# Patient Record
Sex: Male | Born: 1990 | Race: White | Hispanic: No | Marital: Single | Smoking: Never smoker
Health system: Southern US, Community
[De-identification: ages and names within clinical notes are randomized; demographics above are authoritative.]

## PROBLEM LIST (undated history)

## (undated) DIAGNOSIS — G4730 Sleep apnea, unspecified: Secondary | ICD-10-CM

## (undated) DIAGNOSIS — Z8711 Personal history of peptic ulcer disease: Secondary | ICD-10-CM

---

## 2011-11-09 ENCOUNTER — Emergency Department (HOSPITAL_COMMUNITY)
Admission: EM | Admit: 2011-11-09 | Discharge: 2011-11-09 | Disposition: A | Discharge status: Home / Self Care | Payer: Self-pay | Attending: Emergency Medicine | Admitting: Emergency Medicine

## 2011-11-09 ENCOUNTER — Encounter (HOSPITAL_COMMUNITY): Payer: Self-pay

## 2011-11-09 DIAGNOSIS — R070 Pain in throat: Secondary | ICD-10-CM | POA: Insufficient documentation

## 2011-11-09 DIAGNOSIS — B079 Viral wart, unspecified: Secondary | ICD-10-CM | POA: Insufficient documentation

## 2011-11-09 NOTE — ED Notes (Signed)
Pt d/c home in NAD.Pt voiced understanding of follow up care. Pt denies any pain.

## 2011-11-09 NOTE — ED Notes (Signed)
Cold symptoms and sore throat for approximately 5 days   Also has had warts on both hands for 2 years.

## 2011-11-09 NOTE — ED Provider Notes (Signed)
History     CSN: [id number]  Arrival date & time 11/09/11  4010   First MD Initiated Contact with Patient 11/09/11 1042      Chief Complaint  Patient presents with  . Sore Throat    (Consider location/radiation/quality/duration/timing/severity/associated sxs/prior treatment) HPI  21 year old male presents to the ED complaining of warts on hands. Patient states for the past 2 years he has been noticing warts on both of his hands. Warts are nontender and non itchy.  He has tried over-the-counter Dr. Margart Sickles cream, and has tried to use Band-Aid with minimal improvement. He is requesting for alternative treatment. He denies seeing warts anywhere else in his body. Patient state he left his hand on a regular basis but still notice new warts forming. He also states has a mild sore throat but denies fever, sneezing, running nose, productive cough, and voice changes, or neck pain. his primary concern today is treatment for warts  History reviewed. No pertinent past medical history.  History reviewed. No pertinent past surgical history.  No family history on file.  History  Substance Use Topics  . Smoking status: Never Smoker   . Smokeless tobacco: Not on file  . Alcohol Use: No      Review of Systems  All other systems reviewed and are negative.    Allergies  Review of patient's allergies indicates no known allergies.  Home Medications   Current Outpatient Rx  Name Route Sig Dispense Refill  . CALCIUM CARBONATE 1250 MG PO CHEW Oral Chew 1 tablet by mouth daily as needed. Heartburn      BP 128/74  Pulse 78  Temp(Src) 98.7 F (37.1 C) (Oral)  Resp 18  Ht 5\' 9"  (1.753 m)  Wt 230 lb (104.327 kg)  BMI 33.96 kg/m2  SpO2 97%  Physical Exam  Nursing note and vitals reviewed. Constitutional:       Awake, alert, nontoxic appearance  HENT:  Head: Atraumatic.  Mouth/Throat: Oropharynx is clear and moist.  Eyes: Right eye exhibits no discharge. Left eye exhibits no  discharge.  Neck: Neck supple.  Pulmonary/Chest: Effort normal. He exhibits no tenderness.  Abdominal: There is no tenderness. There is no rebound.  Musculoskeletal: He exhibits no tenderness.       Baseline ROM, no obvious new focal weakness  Lymphadenopathy:    He has no cervical adenopathy.  Neurological:       Mental status and motor strength appears baseline for patient and situation  Skin: No rash noted.       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  Non blanchable.  Vericullar appearance.  Non pustular, non petechiae  Psychiatric: He has a normal mood and affect.    ED Course  Procedures (including critical care time)  Labs Reviewed - No data to display No results found.   No diagnosis found.    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 Patient will likely benefit from followup with the dermatology department. He may benefit from cryotherapy procedure, not commonly performed in the ED. Patient throat evaluation was unremarkable, likely viral infection. I will give him a followup referral with dermatology. Patient voiced understanding and agree with plan.        Fayrene Helper, PA-C 11/09/11 1120

## 2011-11-10 NOTE — ED Provider Notes (Signed)
Medical screening examination/treatment/procedure(s) were performed by non-physician practitioner and as supervising physician I was immediately available for consultation/collaboration.  Flint Melter, MD 11/10/11 [phone]

## 2020-05-11 ENCOUNTER — Telehealth: Payer: Self-pay | Admitting: Internal Medicine

## 2020-05-11 ENCOUNTER — Other Ambulatory Visit: Payer: Self-pay

## 2020-05-11 ENCOUNTER — Ambulatory Visit (INDEPENDENT_AMBULATORY_CARE_PROVIDER_SITE_OTHER): Payer: Self-pay | Admitting: Internal Medicine

## 2020-05-11 ENCOUNTER — Encounter: Payer: Self-pay | Admitting: Internal Medicine

## 2020-05-11 VITALS — BP 118/76 | Temp 97.0°F | Ht 68.0 in | Wt 285.0 lb

## 2020-05-11 DIAGNOSIS — G4733 Obstructive sleep apnea (adult) (pediatric): Secondary | ICD-10-CM | POA: Insufficient documentation

## 2020-05-11 DIAGNOSIS — K279 Peptic ulcer, site unspecified, unspecified as acute or chronic, without hemorrhage or perforation: Secondary | ICD-10-CM

## 2020-05-11 MED ORDER — AMPHETAMINE-DEXTROAMPHET ER 20 MG PO CP24: 20.0000 mg | ORAL_CAPSULE | Freq: Every day | ORAL | 0 refills | Status: DC

## 2020-05-11 NOTE — Telephone Encounter (Signed)
First he needs to try Adderall. He remains responsible for safe, alert driving- no letter from me would change that. He can contact us Friday  morning to let us know how he is doing. I will be out of the office in the afternoon.

## 2020-05-11 NOTE — Assessment & Plan Note (Signed)
Patient reported history

## 2020-05-11 NOTE — Patient Instructions (Addendum)
Order- schedule CPAP to BIPAP titration sleep study at sleep center   Dx OSA  We will contact [facility] for a copy of the report of your original diagnostic sleep study done around Feb, 2020, and also name of DME company to which he was referred for CPAP.  Script for Adderall to try if needed for excessive sleepiness.  Be very careful driving- do not drive if sleepy

## 2020-05-11 NOTE — Telephone Encounter (Signed)
Called and spoke with pt. 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 Pt wanted to know if the adderall is working well for him if he could be allowed to return back to work prior to the titration study being preferred or if he would need to be out until he has the study.  Pt wanted to do a trial on the adderall and if he felt like it was working well for him, he wanted to return back to work Saturday, 8/14 if okayed by Dr. Maple Hudson. Pt stated he would call office Friday, 8/13 if he felt like the adderall was working well for him so he could get a note written from Dr. Maple Hudson for him to go back to work Saturday 8/14 if allowed. Pt would need to state in the note that he would be okay to return to work and able to drive a Engineer, production.  Dr. Maple Hudson, please advise on this.

## 2020-05-11 NOTE — Assessment & Plan Note (Signed)
Significant weight gain in recent years. Discussed potential impact on sleep- breathing Plan- exercise and diet

## 2020-05-11 NOTE — Progress Notes (Addendum)
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. Told he had severe apnea and got CPAP Resmed auto 5-20 then, [facility]. Has been getting supplies on-line. Over last 2 years has gained 50-75 lbs. In past 6 months especially more aware of significant daytime sleepiness. Brother who shares truck with him tells him he is not snoring through CPAP, and does not note parasomnias. Patient denies cataplexy and Sleep paralysis. 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 Has been using lots of caffeine. No sleep meds. Denies ENT surgery, heart or lung disease  Prior to Admission medications   Medication Sig Start Date End Date Taking? Authorizing Provider  acetaminophen (TYLENOL) 325 MG tablet Take by mouth. 04/18/20  Yes [provider]  amphetamine-dextroamphetamine (ADDERALL XR) 20 MG 24 hr capsule Take 1 capsule (20 mg total) by mouth daily. 05/11/20   Waymon Budge, MD   No past medical history on file. No past surgical history on file. No family history on file. Social History   Socioeconomic History  . Marital status: Single    Spouse name: Not on file  . Number of children: Not on file  . Years of education: Not on file  . Highest education level: Not on file  Occupational History  . Not on file  Tobacco Use  . Smoking status: Never Smoker  . Smokeless tobacco: Current User    Types: Snuff  Substance and Sexual Activity  . Alcohol use: No  . Drug use: No  . Sexual activity: Not on file  Other Topics Concern  . Not on file  Social History Narrative  . Not on file   Social Determinants of Health   Financial Resource Strain:   .  Difficulty of Paying Living Expenses:   Food Insecurity:   . Worried About Programme researcher, broadcasting/film/video in the Last Year:   . Barista in the Last Year:   Transportation Needs:   . Freight forwarder (Medical):   Marland Kitchen Lack of Transportation (Non-Medical):   Physical Activity:   . Days of Exercise per Week:   . Minutes of Exercise per Session:   Stress:   . Feeling of Stress :   Social Connections:   . Frequency of Communication with Friends and Family:   . Frequency of Social Gatherings with Friends and Family:   . Attends Religious Services:   . Active Member of Clubs or Organizations:   . Attends Banker Meetings:   Marland Kitchen Marital Status:   Intimate Partner Violence:   . Fear of Current or Ex-Partner:   . Emotionally Abused:   Marland Kitchen Physically Abused:   . Sexually Abused:    ROS-see HPI   + = positive Constitutional:    weight loss, night sweats, fevers, chills, fatigue, lassitude. HEENT:    headaches, difficulty swallowing, tooth/dental problems, sore throat,       sneezing, itching, ear ache, nasal congestion, post nasal drip, snoring CV:    chest pain, orthopnea, PND, swelling in lower extremities, anasarca,  dizziness, palpitations Resp:   shortness of breath with exertion or at rest.                productive cough,   non-productive cough, coughing up of blood.              change in color of mucus.  wheezing.   Skin:    rash or lesions. GI:  No-   heartburn, indigestion, abdominal pain, nausea, vomiting, diarrhea,                 change in bowel habits, loss of appetite GU: dysuria, change in color of urine, no urgency or frequency.   flank pain. MS:   joint pain, stiffness, decreased range of motion, back pain. Neuro-     nothing unusual Psych:  change in mood or affect.  depression or anxiety.   memory loss.  OBJ- Physical Exam General- Alert, Oriented, Affect-appropriate, Distress- none acute, +Obese Skin- rash-none, lesions-  none, excoriation- none Lymphadenopathy- none Head- atraumatic            Eyes- Gross vision intact, PERRLA, conjunctivae and secretions clear            Ears- Hearing, canals-normal            Nose- Clear, no-Septal dev, mucus, polyps, erosion, perforation             Throat- Mallampati III , mucosa clear , drainage- none, tonsils+ large, + teeth Neck- flexible , trachea midline, no stridor , thyroid nl, carotid no bruit Chest - symmetrical excursion , unlabored           Heart/CV- RRR , no murmur , no gallop  , no rub, nl s1 s2                           - JVD- none , edema- none, stasis changes- none, varices- none           Lung- clear to P&A, wheeze- none, cough- none , dullness-none, rub- none           Chest wall-  Abd-  Br/ Gen/ Rectal- Not done, not indicated Extrem- cyanosis- none, clubbing, none, atrophy- none, strength- nl Neuro- grossly intact to observation

## 2020-05-11 NOTE — Assessment & Plan Note (Signed)
I suspect with weight gain and large tonsils, he has out-run his CPAP machine and may need BIPAP. Narcolepsy is less likely. 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 We discussed short-term trial of Adderall while we get his original diagnostic sleep report from [place], and do a CPAP/BIPAP titration sleep study.

## 2020-05-12 NOTE — Telephone Encounter (Signed)
Patient is returning phone call. Patient phone number is [phone].

## 2020-05-12 NOTE — Telephone Encounter (Signed)
Called and spoke with pt letting him know the info stated by CY. Pt verbalized understanding and wanted to go ahead and provide [place] with a brief update that he was able to sleep all night last night 8/11 and woke up this morning around 7:30 and began cutting trees. Pt stated that he took the Adderall around 8am this morning 8/12.  Pt stated he would call us tomorrow, Friday morning 8/13 so we can provide CY with that info.

## 2020-05-12 NOTE — Telephone Encounter (Signed)
Tried calling the pt and there was no answer- LMTCB.  

## 2020-05-13 NOTE — Telephone Encounter (Signed)
Pt returning call. States medication was working great until about 3:00 yesterday when he felt tired. Also states when he tried to fall asleep last night around 10 it took him 4 hours to fall asleep. Can be reached at [phone]

## 2020-05-13 NOTE — Telephone Encounter (Signed)
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 Please advise.

## 2020-05-13 NOTE — Telephone Encounter (Signed)
Dr. Maple Hudson please advise messages bellow from patient.    Pt returning call and states medication was working great until about 3:00 yesterday when he felt tired. Also states when he tried to fall asleep last night around 10 it took him 4 hours to fall asleep. He thinks that it may be because he didn't eat dinner. He states he was busy and did not eat dinner and by the time he was done and ready for bed it was 10 pm and he didn't want to cook anything so only ate a pack of crackers. So he thinks that is why it took him so long to fall asleep.  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   Please advise if you are ok with writing letter and any other recommendations for patient.

## 2020-05-15 ENCOUNTER — Encounter: Payer: Self-pay | Admitting: Internal Medicine

## 2020-05-16 ENCOUNTER — Encounter: Payer: Self-pay | Admitting: Internal Medicine

## 2020-05-19 ENCOUNTER — Institutional Professional Consult (permissible substitution): Payer: Self-pay | Admitting: Internal Medicine

## 2020-05-23 NOTE — Telephone Encounter (Signed)
Message sent to Dr. Roxy Cedar nurse to have Dr. Maple Hudson advise on

## 2020-05-30 NOTE — Telephone Encounter (Signed)
Already addressed by Dr. Maple Hudson

## 2020-06-01 ENCOUNTER — Telehealth: Payer: Self-pay | Admitting: Internal Medicine

## 2020-06-01 NOTE — Telephone Encounter (Signed)
Patient Instructions by Waymon Budge, MD at 05/11/2020 10:00 AM Author: Waymon Budge, MD Author Type: Physician Filed: 05/11/2020 10:33 AM  Note Status: Addendum Cosign: Cosign Not Required Encounter Date: 05/11/2020  Editor: Waymon Budge, MD (Physician)      Prior Versions: 1. Waymon Budge, MD (Physician) at 05/11/2020 10:30 AM - Signed      Order- schedule CPAP to BIPAP titration sleep study at sleep center   Dx OSA  We will contact [facility] for a copy of the report of your original diagnostic sleep study done around Feb, 2020, and also name of DME company to which he was referred for CPAP.  Script for Adderall to try if needed for excessive sleepiness.  Be very careful driving- do not drive if sleepy     Checked Dr. Roxy Cedar mail folder in the pod and I did not see a sleep study in there on pt. Dr. Maple Hudson, please advise if you have received pt's sleep study from [facility] yet or if we still need to contact them as [facility] is asking about this.

## 2020-06-02 NOTE — Telephone Encounter (Signed)
I have not gotten the original diagnostic sleep study report from [facility]

## 2020-06-03 NOTE — Telephone Encounter (Signed)
Spoke with patient to notify him that we have not got his sleep study report from [facility] from Feb 2020. Patient stated that he has a appt for CPAP titration on 9/11. Patient stated he will need  A refill on his Addrell 20mg   Sent into his pharmacy . Dr can you please advise. Thank you

## 2020-06-05 ENCOUNTER — Other Ambulatory Visit: Payer: Self-pay | Admitting: Internal Medicine

## 2020-06-05 MED ORDER — AMPHETAMINE-DEXTROAMPHET ER 20 MG PO CP24: 20.0000 mg | ORAL_CAPSULE | Freq: Every day | ORAL | 0 refills | Status: DC

## 2020-06-05 NOTE — Telephone Encounter (Signed)
Adderall refill sent to [facility]  Please contact [facility] medical records again to see if they can find an original diagnostic sleep study on Mr Netto.

## 2020-06-07 NOTE — Telephone Encounter (Signed)
[facility] and they need a fax cover sheet faxed to their office requesting the sleep study. Fax: [phone].   Will forward to triage to fax.

## 2020-06-07 NOTE — Telephone Encounter (Signed)
I have faxed request to [facility] at fax number provided. Will await fax of pt's sleep study results.

## 2020-06-10 ENCOUNTER — Telehealth: Payer: Self-pay | Admitting: Internal Medicine

## 2020-06-10 NOTE — Telephone Encounter (Signed)
Left message for patient to call back on Monday morning.  

## 2020-06-11 ENCOUNTER — Ambulatory Visit (HOSPITAL_BASED_OUTPATIENT_CLINIC_OR_DEPARTMENT_OTHER): Payer: Self-pay | Attending: Internal Medicine | Admitting: Internal Medicine

## 2020-06-11 ENCOUNTER — Other Ambulatory Visit: Payer: Self-pay

## 2020-06-11 VITALS — Ht 68.0 in | Wt 265.0 lb

## 2020-06-11 DIAGNOSIS — G4733 Obstructive sleep apnea (adult) (pediatric): Secondary | ICD-10-CM | POA: Insufficient documentation

## 2020-06-11 DIAGNOSIS — R0902 Hypoxemia: Secondary | ICD-10-CM | POA: Insufficient documentation

## 2020-06-11 NOTE — Telephone Encounter (Signed)
Fax has been received and is in Dr. Roxy Cedar mail folder. He will be able to review results Monday 9/13. Routing this encounter to Dr. Maple Hudson as an Lorain Childes.

## 2020-06-14 NOTE — Telephone Encounter (Signed)
lmtcb for pt to call back if he still needs to the Adderall script changed to the another pharmacy. Will try to reach pt one more time before signing off.

## 2020-06-16 NOTE — Telephone Encounter (Signed)
Attempted to reach pt x 3. Left detailed message for pt to call our office back if he still needs assistance otherwise to disregard the call. Will sign off on message due to several unsuccessful attempts to reach pt, per triage protocol.

## 2020-06-16 NOTE — Telephone Encounter (Signed)
Unable to close message as there is an incomplete note.   Dr. Maple Hudson please advise if this message can be closed. If so, please sign off on note within this encounter.

## 2020-06-17 ENCOUNTER — Telehealth: Payer: Self-pay | Admitting: Internal Medicine

## 2020-06-17 NOTE — Telephone Encounter (Signed)
Patient called requesting HST results from 9/11//21. Patient stated he will be flying to [place] Monday 06/20/20, so he will not be able to take any calls from 7-12. Patient stated he should be able to take calls after 1pm central time. Patient cancelled 06/22/20 OV with Dr Maple Hudson, because he will be out of time. Patient stated he could reschedule after getting HST results and recommendations.  Message routed to Dr Maple Hudson to advise

## 2020-06-19 DIAGNOSIS — G4733 Obstructive sleep apnea (adult) (pediatric): Secondary | ICD-10-CM

## 2020-06-19 NOTE — Procedures (Signed)
   Patient Name: William Murphy, William Murphy Date: 06/11/2020 Gender: Male D.O.B: [birth year] Age (years): 29 Referring Provider: Jetty Duhamel MD, ABSM Height (inches): 68 Interpreting Physician: Jetty Duhamel MD, ABSM Weight (lbs): 265 RPSGT: Rolene Arbour BMI: 40 MRN: [id number] Neck Size: 19.00  CLINICAL INFORMATION The patient is referred for a BiPAP titration to treat sleep apnea.  Date of NPSG, Split Night or HST: HST ([facility]) 05/12/2019  AHI 73.7/ hr, desaturation to 71%, body weight 213 lbs  SLEEP STUDY TECHNIQUE As per the AASM Manual for the Scoring of Sleep and Associated Events v2.3 (April 2016) with a hypopnea requiring 4% desaturations.  The channels recorded and monitored were frontal, central and occipital EEG, electrooculogram (EOG), submentalis EMG (chin), nasal and oral airflow, thoracic and abdominal wall motion, anterior tibialis EMG, snore microphone, electrocardiogram, and pulse oximetry. Bilevel positive airway pressure (BPAP) was initiated at the beginning of the study and titrated to treat sleep-disordered breathing.  MEDICATIONS Medications self-administered by patient taken the night of the study : none reported  RESPIRATORY PARAMETERS Optimal IPAP Pressure (cm):  AHI at Optimal Pressure (/hr) N/A Optimal EPAP Pressure (cm):    Overall Minimal O2 (%): 70.0 Minimal O2 at Optimal Pressure (%): 70.0 SLEEP ARCHITECTURE Start Time: 10:19:02 PM Stop Time: 5:00:49 AM Total Time (min): 401.8 Total Sleep Time (min): 388.4 Sleep Latency (min): 1.9 Sleep Efficiency (%): 96.7% REM Latency (min): 73.0 WASO (min): 11.5 Stage N1 (%): 0.5% Stage N2 (%): 78.9% Stage N3 (%): 6.1% Stage R (%): 14.6 Supine (%): 58.73 Arousal Index (/hr): 10.7   CARDIAC DATA The 2 lead EKG demonstrated sinus rhythm. The mean heart rate was 69.4 beats per minute. Other EKG findings include: None.  LEG MOVEMENT DATA The total Periodic Limb Movements of Sleep (PLMS) were 0. The PLMS index  was 0.0. A PLMS index of <15 is considered normal in adults.  IMPRESSIONS - CPAP provided inadequate control at tolerated pressures and was changed to BIPAP titration. - BIPAP was titrated to 20/'16, with progresssive appearance of central apneas.  Optimum was 18/14 withresidual AHI 0/ hr. - Severe Central Sleep Apnea was noted during this titration (CAI = 48.0/h). - Oxygen desaturations were observed during this titration (min O2 = 70.0%). Mean saturation at BIPAP 18/14 was 86.7%. - The patient snored with moderate snoring volume. - No cardiac abnormalities were observed during this study. - Clinically significant periodic limb movements were not noted during this study.   DIAGNOSIS - Obstructive Sleep Apnea (G47.33) - Nocturnal Hypoxemia (G47.36)  RECOMMENDATIONS - Recommend a trial of BIPAP 18/14 or  Auto-BiPAP 6 - 18 cm H2O. - Recommend supplemental O2 at 2L/ min during sleep. - Patient used a small Fisher-Paykel Simplus Full Face mask with humidifier. - Be careful with alcohol, sedatives and other CNS depressants that may worsen sleep apnea and disrupt normal sleep architecture. - Sleep hygiene should be reviewed to assess factors that may improve sleep quality. - Weight management and regular exercise should be initiated or continued.  [Electronically signed] 06/19/2020 03:47 PM  Jetty Duhamel MD, ABSM Diplomate, American Board of Sleep Medicine   NPI: [id number]                         Jetty Duhamel Diplomate, American Board of Sleep Medicine  ELECTRONICALLY SIGNED ON:  06/19/2020, 3:37 PM [facility] PH: [phone]   FX: [phone] ACCREDITED BY THE AMERICAN ACADEMY OF SLEEP MEDICINE

## 2020-06-20 ENCOUNTER — Telehealth: Payer: Self-pay | Admitting: Internal Medicine

## 2020-06-20 DIAGNOSIS — G4734 Idiopathic sleep related nonobstructive alveolar hypoventilation: Secondary | ICD-10-CM

## 2020-06-20 DIAGNOSIS — G4733 Obstructive sleep apnea (adult) (pediatric): Secondary | ICD-10-CM

## 2020-06-20 NOTE — Telephone Encounter (Signed)
This response was attached to an earlier message: Needs orders placed please.  William Murphy titration sleep study shows that he needs a kind of PAP machine called BIPAP, and that he also needs oxygen added to this.  Order- new DME, new BIPAP auto 6-18, mask of choice, humidifier, supplies, AirView/ card             New O2 through BIPAP during sleep 2L  fo dx nocturnal hypoxemia  Please make sure he has an appointment with me in 31-90 days per insurance regs.

## 2020-06-20 NOTE — Telephone Encounter (Signed)
William Murphy titration sleep study shows that he needs a kind of PAP machine called BIPAP, and that he also needs oxygen added to this.  Order- new DME, new BIPAP auto 6-18, mask of choice, humidifier, supplies, AirView/ card             New O2 through BIPAP during sleep 2L  fo dx nocturnal hypoxemia  Please make sure he has an appointment with me in 31-90 days per insurance regs.

## 2020-06-20 NOTE — Telephone Encounter (Signed)
Attempted to call pt but unable to reach. Left message for him to return call. °

## 2020-06-20 NOTE — Telephone Encounter (Signed)
Spoke with patient regarding HST result's .Advised patient that his result's have not been resulted yet . Per Dr. Maple Hudson order  for BIPAP done .FYI  Patient stated he will be out of town when he needs his next refill.   Dr.Young can you please advise.  Thank you     Mr Dittmar titration sleep study shows that he needs a kind of PAP machine called BIPAP, and that he also needs oxygen added to this.  Order- new DME, new BIPAP auto 6-18, mask of choice, humidifier, supplies, AirView/ card  New O2 through BIPAP during sleep 2L fo dx nocturnal hypoxemia  Please make sure he has an appointment with me in 31-90 days per insurance regs.

## 2020-06-21 ENCOUNTER — Encounter: Payer: Self-pay | Admitting: *Deleted

## 2020-06-21 NOTE — Telephone Encounter (Signed)
Attempted to call pt but unable to reach. Left message for  Him to return call. Due to multiple attempts trying to reach pt and unable to do so, letter will be sent to pt and encounter will be closed.

## 2020-06-21 NOTE — Telephone Encounter (Signed)
His original home sleep test 05/12/2019 done by [facility] showed severe obstructive sleep apnea averaging 73.7 apneas/ hour. The results of his titration sleep study and order for BIPAP and O2 were included in an earlier message in this chain.  He will need to be seen, preferably by me, within 31-90 days after BIPAP order placed.  Does he need Adderall refilled now? I can refill it as needed at least until we discuss at next ov.

## 2020-06-22 ENCOUNTER — Ambulatory Visit: Payer: Self-pay | Admitting: Internal Medicine

## 2020-06-22 NOTE — Telephone Encounter (Signed)
Message from prior encounter 9/17:  William Murphy titration sleep study shows that he needs a kind of PAP machine called BIPAP, and that he also needs oxygen added to this.  Order- new DME, new BIPAP auto 6-18, mask of choice, humidifier, supplies, AirView/ card  New O2 through BIPAP during sleep 2L fo dx nocturnal hypoxemia  Please make sure he has an appointment with me in 31-90 days per insurance regs.  Called and spoke with pt in regards to the HST that was performed 05/2019 at [facility] and also in regards to the titration study and he verbalized understanding.   Went to place order for BIPAP start but it was asking for EPAP, IPAP, and pressure support. Dr. Maple Hudson, please advise if we are to delete that info and just state for settings to be BIPAP auto 6-18?  While speaking with pt, he wanted to know if CY thought a tonsillectomy might help in regards to how bad his sleep apnea was as he said this had been mentioned during OV. Dr. Maple Hudson, please advise.

## 2020-06-22 NOTE — Telephone Encounter (Signed)
LMTCB

## 2020-06-22 NOTE — Telephone Encounter (Signed)
Called and spoke with pt letting him know the info stated by CY that we were going to refer him to ENT for consideration of surgery to help improve OSA and he verbalized understanding. Order for BIPAP and ENT referral have both been placed.nothing further needed.

## 2020-06-22 NOTE — Telephone Encounter (Signed)
1) I think we can just order his BIPAP as auto 6-18. If DME needs fixed numbers please let me know.  2) ok to refer to [facility] ENT [facility] on [facility])  Consider surgery to improve airway for OSA

## 2020-06-23 ENCOUNTER — Telehealth: Payer: Self-pay | Admitting: Internal Medicine

## 2020-06-23 NOTE — Telephone Encounter (Signed)
Sherry rec'd sleep study & OV notes.

## 2020-06-23 NOTE — Telephone Encounter (Signed)
ATC patient.  Left vm to return call regarding bipap.

## 2020-06-23 NOTE — Telephone Encounter (Signed)
Per [facility], they will need the original sleep study & the OV notes that ordered the sleep study.    I was not able to locate the sleep study in the file or the OV notes that ordered the sleep study.

## 2020-06-25 NOTE — Telephone Encounter (Signed)
ATCLM x 2

## 2020-06-29 NOTE — Telephone Encounter (Signed)
[facility], please advise if there is another DME pt might be able to be referred to.

## 2020-06-29 NOTE — Telephone Encounter (Signed)
Far as we know there are not any DME companies that have any kind of help for no insurance.

## 2020-06-29 NOTE — Telephone Encounter (Signed)
Patient has Bipap ordered but he does not have any insurance. Are there any suggestions or recommendations? Please advise.

## 2020-07-19 NOTE — Telephone Encounter (Signed)
Called and spoke with patient, he is currently out of [place] and will return soon and go back to work and get insurance.  He will contact our office once he has insurance so we can move forward with getting him a bipap.  In the meantime, he would like to know if Dr. Maple Hudson wants him to stay on the Adderall xr until he can get the bipap.  Dr. Maple Hudson please advise regarding the Adderall xr, he states his script runs out soon.  Thank you.

## 2020-07-19 NOTE — Telephone Encounter (Signed)
Dr. Young please advise.  Thanks! 

## 2020-07-19 NOTE — Telephone Encounter (Signed)
There used to be a charity source of some kind that would provide CPAP and Bipap machines. Is that no longer up and running?

## 2020-07-19 NOTE — Telephone Encounter (Signed)
There used to be the cpap assistance program but it no longer exists.

## 2020-07-21 NOTE — Telephone Encounter (Signed)
LMTC x 1 for pt - Need to find out how many tabs of Adderall he has left (last script written on 06/05/20 for 30 tabs) and what pharmacy he needs it sent to.

## 2020-07-21 NOTE — Telephone Encounter (Signed)
We can refill adderall. Please let me know if it is time to reorder and where it should be sent.

## 2020-07-22 MED ORDER — AMPHETAMINE-DEXTROAMPHET ER 20 MG PO CP24: 20.0000 mg | ORAL_CAPSULE | Freq: Every day | ORAL | 0 refills | Status: DC

## 2020-07-22 NOTE — Telephone Encounter (Signed)
Adderall  Refill e-sent as requested

## 2020-07-22 NOTE — Telephone Encounter (Signed)
Called and spoke with pt letting him know that CY refilled his adderall for him. Pt verbalized understanding. Nothing further needed.

## 2020-07-22 NOTE — Telephone Encounter (Signed)
Called and spoke with pharmacist at [facility] on [address] (where last RX was sent to). Pharmacist verified that the last RX was picked up on 06/06/20 for 30 capsules.   Called and spoke with patient. He stated that he took his last Adderall this morning. He wishes to use the [facility] in [place] on [address].   Pharmacy has been updated in chart.   Dr. Maple Hudson, please advise. Thanks!

## 2020-09-11 NOTE — Progress Notes (Deleted)
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 Told he had severe apnea and got CPAP Resmed auto 5-20 then, [facility]FM. Has been getting supplies on-line. Over last 2 years has gained 50-75 lbs. In past 6 months especially more aware of significant daytime sleepiness. Brother who shares truck with him tells him he is not snoring through CPAP, and does not note parasomnias. Patient denies cataplexy and Sleep paralysis. 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 Has been using lots of caffeine. No sleep meds. 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 HEENT:    headaches, difficulty swallowing, tooth/dental problems, sore throat,       sneezing, itching, ear ache, nasal congestion, post nasal drip, snoring CV:    chest pain, orthopnea, PND, swelling in lower extremities, anasarca,                                  dizziness, palpitations Resp:   shortness of breath with exertion or at rest.                productive cough,   non-productive cough, coughing up of blood.              change in color of mucus.  wheezing.   Skin:    rash or lesions. GI:  No-   heartburn, indigestion, abdominal  pain, nausea, vomiting, diarrhea,                 change in bowel habits, loss of appetite GU: dysuria, change in color of urine, no urgency or frequency.   flank pain. MS:   joint pain, stiffness, decreased range of motion, back pain. Neuro-     nothing unusual Psych:  change in mood or affect.  depression or anxiety.   memory loss.  OBJ- Physical Exam General- Alert, Oriented, Affect-appropriate, Distress- none acute, +Obese Skin- rash-none, lesions- none, excoriation- none Lymphadenopathy- none Head- atraumatic            Eyes- Gross vision intact, PERRLA, conjunctivae and secretions clear            Ears- Hearing, canals-normal            Nose- Clear, no-Septal dev, mucus, polyps, erosion, perforation             Throat- Mallampati III , mucosa clear , drainage- none, tonsils+ large, + teeth Neck- flexible , trachea midline, no stridor , thyroid nl, carotid no bruit Chest - symmetrical excursion , unlabored           Heart/CV- RRR , no murmur , no gallop  , no rub, nl  s1 s2                           - JVD- none , edema- none, stasis changes- none, varices- none           Lung- clear to P&A, wheeze- none, cough- none , dullness-none, rub- none           Chest wall-  Abd-  Br/ Gen/ Rectal- Not done, not indicated Extrem- cyanosis- none, clubbing, none, atrophy- none, strength- nl Neuro- grossly intact to observation

## 2020-09-12 ENCOUNTER — Telehealth: Payer: Self-pay | Admitting: Internal Medicine

## 2020-09-12 ENCOUNTER — Ambulatory Visit: Payer: Self-pay | Admitting: Internal Medicine

## 2020-09-12 MED ORDER — AMPHETAMINE-DEXTROAMPHET ER 20 MG PO CP24: 20.0000 mg | ORAL_CAPSULE | Freq: Every day | ORAL | 0 refills | Status: DC

## 2020-09-12 NOTE — Telephone Encounter (Signed)
09/12/2020  Called and left voicemail for patient.  Notified patient via voicemail that medications were sent in.  Patient was instructed to contact our office with additional questions or concerns.  Per DPR patient okayed [place] to be able to leave detailed voice messages.  Nothing further is needed we will close this message.  Elisha Headland, FNP

## 2020-09-12 NOTE — Telephone Encounter (Signed)
Spoke with pt, states he just started a new job and will have to wait until he has health insurance to get his bipap machine.  Pt states he will call us once he gets the bipap to schedule a follow-up. Pt also notes that the Adderall is helping him greatly, wants to know if CY still wants him to stay on this until he gets the bipap even though the time frame has changed.    I have pended rx to preferred pharmacy in this encounter in case CY wants to refill this rx.  CY please advise.  Thanks!

## 2020-09-12 NOTE — Telephone Encounter (Signed)
Adderall refilled

## 2020-11-08 ENCOUNTER — Telehealth: Payer: Self-pay | Admitting: Internal Medicine

## 2020-11-08 MED ORDER — AMPHETAMINE-DEXTROAMPHET ER 20 MG PO CP24: 20.0000 mg | ORAL_CAPSULE | Freq: Every day | ORAL | 0 refills | Status: DC

## 2020-11-08 NOTE — Telephone Encounter (Signed)
Received faxed refill request from ---call from pt   Medication name/strength/dose: adderall 20 mg Medication last rx'd: 09/12/2020 Quantity and number of refills last rx'd:#30 with no refills Instructions: take one capsule daily  Last OV: 05/11/2020 Next OV: no pending appts  CY please advise on refill request  Allergies  Allergen Reactions  . Other Nausea And Vomiting   Current Outpatient Medications on File Prior to Visit  Medication Sig Dispense Refill  . acetaminophen (TYLENOL) 325 MG tablet Take by mouth.    Marland Kitchen amphetamine-dextroamphetamine (ADDERALL XR) 20 MG 24 hr capsule Take 1 capsule (20 mg total) by mouth daily. 30 capsule 0   No current facility-administered medications on file prior to visit.

## 2020-11-08 NOTE — Telephone Encounter (Signed)
Adderall refilled

## 2020-11-08 NOTE — Telephone Encounter (Signed)
Spoke with patient. He is aware the refill has been sent in for him. He verbalized understanding. Nothing further needed at time of call.

## 2020-11-15 ENCOUNTER — Telehealth: Payer: Self-pay | Admitting: Internal Medicine

## 2020-11-15 NOTE — Telephone Encounter (Signed)
Call returned to patient, confirmed DOB. Made appt for 11/17/20. Voiced understanding.   Nothing further needed at this time.

## 2020-11-15 NOTE — Telephone Encounter (Signed)
I need to see him - please work him in earliest available- ok to use held spot

## 2020-11-15 NOTE — Telephone Encounter (Signed)
Call returned to patient, confirmed DOB. Patient reports he was given adderall about 5 months ago. He reports he is taking as prescribed. He said he did an experiment and he took his self off the adderall and no matter what he does he is still not sleeping. He reports increased dizziness and adderall. He states he is not sleeping at all and is then crashing for a day and a half. Patient states he is desperate and is willing to try again. Patient reports he has been awake for several days now.   CY please advise.   Thanks

## 2020-11-16 NOTE — Progress Notes (Signed)
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 Told he had severe apnea and got CPAP Resmed auto 5-20 then, FFM. Has been getting supplies on-line. Over last 2 years has gained 50-75 lbs. In past 6 months especially more aware of significant daytime sleepiness. Brother who shares truck with him tells him he is not snoring through CPAP, and does not note parasomnias. Patient denies cataplexy and Sleep paralysis. 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 Has been using lots of caffeine. No sleep meds. 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 With this, he has been sleeping poorly, and reports almost no sleep in the last 9 days. OTC sleep meds have not helped. Needs to get up and move. Has continued Adderall 1 daily. This wears off between 3 and 5PM. He is avoiding caffeine.  He asks help getting sleep. No other stimulants or sedatives.  He tried quitting adderall for 1 month in November, with no change in "crawling feeling" when he needs to get up and move.  Recently at [facility] ED for "dizziness". Unclear what they thought. Labs drawn.  No caffeine, no sleep  aids. Increased low back pain affects sleep.    ROS-see HPI   + = positive Constitutional:    weight loss, night sweats, fevers, chills, fatigue, lassitude. HEENT:    headaches, difficulty swallowing, tooth/dental problems, sore throat,       sneezing, itching, ear ache, nasal congestion, post nasal drip, snoring CV:    chest pain, orthopnea, PND, swelling in lower extremities, anasarca,                                   dizziness, palpitations Resp:   shortness of breath with exertion or at rest.                productive cough,   non-productive cough, coughing up of blood.              change in color of mucus.  wheezing.   Skin:    rash or lesions. GI:  No-   heartburn, indigestion, abdominal pain, nausea, vomiting, diarrhea,                 change in bowel habits, loss of appetite GU: dysuria, change in color of urine, no urgency or frequency.   flank pain. MS:   joint pain, stiffness, decreased range of motion, back pain. Neuro-    + HPI Psych:  change in mood or affect.  depression or anxiety.   memory loss.  OBJ- Physical Exam General- Alert, Oriented, Affect-appropriate, calm, Distress-  none acute, +Overweight Skin- rash-none, lesions- none, excoriation- none Lymphadenopathy- none Head- atraumatic          Eyes- Gross vision intact, PERRLA, conjunctivae and secretions clear            Ears- Hearing, canals-normal            Nose- Clear, no-Septal dev, mucus, polyps, erosion, perforation             Throat- Mallampati III , mucosa clear , drainage- none, tonsils+ large, + teeth Neck- flexible , trachea midline, no stridor , thyroid nl, carotid no bruit Chest - symmetrical excursion , unlabored           Heart/CV- RRR , no murmur , no gallop  , no rub, nl s1 s2                           - JVD- none , edema- none, stasis changes- none, varices- none           Lung- clear to P&A, wheeze- none, cough- none , dullness-none, rub- none           Chest wall-  Abd-  Br/ Gen/ Rectal-  Not done, not indicated Extrem- cyanosis- none, clubbing, none, atrophy- none, strength- nl Neuro- grossly intact to observation

## 2020-11-17 ENCOUNTER — Encounter: Payer: Self-pay | Admitting: Internal Medicine

## 2020-11-17 ENCOUNTER — Ambulatory Visit (INDEPENDENT_AMBULATORY_CARE_PROVIDER_SITE_OTHER): Payer: BC Managed Care – PPO | Admitting: Internal Medicine

## 2020-11-17 ENCOUNTER — Other Ambulatory Visit: Payer: Self-pay

## 2020-11-17 DIAGNOSIS — F5101 Primary insomnia: Secondary | ICD-10-CM | POA: Diagnosis not present

## 2020-11-17 DIAGNOSIS — G4733 Obstructive sleep apnea (adult) (pediatric): Secondary | ICD-10-CM

## 2020-11-17 DIAGNOSIS — M545 Low back pain, unspecified: Secondary | ICD-10-CM

## 2020-11-17 MED ORDER — CLONAZEPAM 0.5 MG PO TABS: ORAL_TABLET | ORAL | 1 refills | Status: DC

## 2020-11-17 NOTE — Patient Instructions (Signed)
Continue CPAP auto 5-20  Ok to continue Adderall XR 20 mg once daily if needed  Script sent to try clonazepam 0.5 mg, 1 or 2 tabs for sleep as needed   Release of records so we can get lab work from [facility]  I suggest heat, stretching and possibly physical therapy for your back discomfort  Please call as needed

## 2020-11-17 NOTE — Assessment & Plan Note (Signed)
He is mostly compliant with CPAP, with AHI around 4/ hr. Recent insomnia has interfered.  Plan- continue OSA 5-20

## 2020-11-18 ENCOUNTER — Emergency Department
Admission: EM | Admit: 2020-11-18 | Discharge: 2020-11-18 | Disposition: A | Discharge status: Home / Self Care | Payer: BC Managed Care – PPO | Attending: Emergency Medicine | Admitting: Emergency Medicine

## 2020-11-18 ENCOUNTER — Other Ambulatory Visit: Payer: Self-pay

## 2020-11-18 ENCOUNTER — Telehealth: Payer: Self-pay | Admitting: Internal Medicine

## 2020-11-18 ENCOUNTER — Emergency Department: Payer: BC Managed Care – PPO

## 2020-11-18 ENCOUNTER — Encounter: Payer: Self-pay | Admitting: Emergency Medicine

## 2020-11-18 DIAGNOSIS — M79674 Pain in right toe(s): Secondary | ICD-10-CM | POA: Diagnosis present

## 2020-11-18 HISTORY — DX: Personal history of peptic ulcer disease: Z87.11

## 2020-11-18 HISTORY — DX: Sleep apnea, unspecified: G47.30

## 2020-11-18 LAB — BASIC METABOLIC PANEL
Anion gap: 8 (ref 5–15)
BUN: 9 mg/dL (ref 6–20)
CO2: 26 mmol/L (ref 22–32)
Calcium: 8.8 mg/dL — ABNORMAL LOW (ref 8.9–10.3)
Chloride: 104 mmol/L (ref 98–111)
Creatinine, Ser: 0.8 mg/dL (ref 0.61–1.24)
GFR, Estimated: >60 mL/min (ref >60)
Glucose, Bld: 85 mg/dL (ref 70–99)
Potassium: 3.7 mmol/L (ref 3.5–5.1)
Sodium: 138 mmol/L (ref 135–145)

## 2020-11-18 LAB — CBC WITH DIFFERENTIAL/PLATELET
Abs Immature Granulocytes: 0.02 10*3/uL (ref 0.00–0.07)
Basophils Absolute: 0.1 10*3/uL (ref 0.0–0.1)
Basophils Relative: 1 %
Eosinophils Absolute: 0 10*3/uL (ref 0.0–0.5)
Eosinophils Relative: 0 %
HCT: 45.2 % (ref 39.0–52.0)
Hemoglobin: 15.8 g/dL (ref 13.0–17.0)
Immature Granulocytes: 0 %
Lymphocytes Relative: 32 %
Lymphs Abs: 4.1 10*3/uL — ABNORMAL HIGH (ref 0.7–4.0)
MCH: 29.5 pg (ref 26.0–34.0)
MCHC: 35 g/dL (ref 30.0–36.0)
MCV: 84.3 fL (ref 80.0–100.0)
Monocytes Absolute: 0.7 10*3/uL (ref 0.1–1.0)
Monocytes Relative: 6 %
Neutro Abs: 8.1 10*3/uL — ABNORMAL HIGH (ref 1.7–7.7)
Neutrophils Relative %: 61 %
Platelets: 323 10*3/uL (ref 150–400)
RBC: 5.36 MIL/uL (ref 4.22–5.81)
RDW: 13.2 % (ref 11.5–15.5)
WBC: 13 10*3/uL — ABNORMAL HIGH (ref 4.0–10.5)
nRBC: 0 % (ref 0.0–0.2)

## 2020-11-18 LAB — URIC ACID: Uric Acid, Serum: 7.5 mg/dL (ref 3.7–8.6)

## 2020-11-18 MED ORDER — PREDNISONE 20 MG PO TABS: 20.0000 mg | ORAL_TABLET | Freq: Two times a day (BID) | ORAL | 0 refills | Status: AC

## 2020-11-18 MED ORDER — HYDROCODONE-ACETAMINOPHEN 5-325 MG PO TABS: 1.0000 | ORAL_TABLET | Freq: Three times a day (TID) | ORAL | 0 refills | Status: AC | PRN

## 2020-11-18 MED ORDER — KETOROLAC TROMETHAMINE 30 MG/ML IJ SOLN
30.0000 mg | Freq: Once | INTRAMUSCULAR | Status: AC
  Administered 2020-11-18: 30 mg via INTRAVENOUS
  Filled 2020-11-18: qty 1

## 2020-11-18 MED ORDER — PREDNISONE 20 MG PO TABS
60.0000 mg | ORAL_TABLET | Freq: Once | ORAL | Status: AC
  Administered 2020-11-18: 60 mg via ORAL
  Filled 2020-11-18: qty 3

## 2020-11-18 MED ORDER — SULFAMETHOXAZOLE-TRIMETHOPRIM 800-160 MG PO TABS: 1.0000 | ORAL_TABLET | Freq: Two times a day (BID) | ORAL | 0 refills | Status: DC

## 2020-11-18 MED ORDER — SULFAMETHOXAZOLE-TRIMETHOPRIM 800-160 MG PO TABS
1.0000 | ORAL_TABLET | Freq: Once | ORAL | Status: AC
  Administered 2020-11-18: 1 via ORAL
  Filled 2020-11-18: qty 1

## 2020-11-18 MED ORDER — HYDROCODONE-ACETAMINOPHEN 5-325 MG PO TABS
1.0000 | ORAL_TABLET | Freq: Once | ORAL | Status: AC
  Administered 2020-11-18: 1 via ORAL
  Filled 2020-11-18: qty 1

## 2020-11-18 NOTE — ED Triage Notes (Signed)
Pt comes into the ED via POV c/o right great toe pain.  Pt denies any known injury to the toe.  Pt in NAD and is ambulatory to triage.

## 2020-11-18 NOTE — Discharge Instructions (Addendum)
Your x-ray is negative for any acute findings.  Uric acid level was also normal, which not indicate acute gout.  Take the antibiotics and steroid as directed, and pain medicine as needed.  Follow-up with podiatry for ongoing symptomatic.  Return to the ED if necessary.

## 2020-11-18 NOTE — Telephone Encounter (Signed)
Spoke with patient. He stated that at the time of the call he was currently driving and could not take down the name of the behavioral health place. But he stated that he would look up the information as soon as he was able to. I advised him to please call us back if anything changes or if he needed to talk to someone. He verbalized understanding.    I will place a personal reminder for myself to call and check on him on Monday morning to see how the weekend went for him.

## 2020-11-18 NOTE — Telephone Encounter (Signed)
Spoke with patient. He stated that he was prescribed Klonopin 0.5mg  yesterday. He has not slept in 9 days but was able to sleep well last night after taking the Klonopin. When he woke up this morning, he stated that he had "more suicidal thoughts than usual". He confirmed he has not taken any other medications since last night. He did make the comment of "I am surrounded by guns and if I really wanted to take my life, I can do so very easily."   I advised him to not take any more of the Klonopin and I will send a message to [facility] for his advise. He verbalized understanding.   Spoke with CY verbally about patient. He recommended patient being evaluated at the [facility] at [address] [place] [address]. Phone # is [phone]

## 2020-11-18 NOTE — ED Provider Notes (Signed)
[facility] Emergency Department Provider Note ____________________________________________  Time seen: 1946  I have reviewed the triage vital signs and the nursing notes.  HISTORY  Chief Complaint  Toe Pain  HPI William Murphy is a 30 y.o. male presents himself to the ED for evaluation of  right great toe pain.  Patient denies any injury or trauma to the toe. He notes onset this morning.  He denies any history of chronic ongoing foot or ankle problems.  He does give a remote history of a similar episode of sudden great toe pain about 2 years prior.  He did not seek attention at that time, reports symptoms resolved without intervention.  He has been taking Tylenol for this particular acute pain without significant benefit.  Past Medical History:  Diagnosis Date  . History of stomach ulcers   . Sleep apnea     Patient Active Problem List   Diagnosis Date Noted  . Obstructive sleep apnea 05/11/2020  . Morbid (severe) obesity due to excess calories (HCC) 05/11/2020  . Peptic ulcer 05/11/2020    History reviewed. No pertinent surgical history.  Prior to Admission medications   Medication Sig Start Date End Date Taking? Authorizing Provider  HYDROcodone-acetaminophen (NORCO) 5-325 MG tablet Take 1 tablet by mouth 3 (three) times daily as needed for up to 3 days. 11/18/20 11/21/20 Yes William Murphy, William Ivory, PA-C  predniSONE (DELTASONE) 20 MG tablet Take 1 tablet (20 mg total) by mouth 2 (two) times daily with a meal for 5 days. 11/18/20 11/23/20 Yes William Murphy, William Ivory, PA-C  sulfamethoxazole-trimethoprim (BACTRIM DS) 800-160 MG tablet Take 1 tablet by mouth 2 (two) times daily. 11/18/20  Yes William Murphy, William Ivory, PA-C  acetaminophen (TYLENOL) 325 MG tablet Take by mouth. 04/18/20   [provider]  amphetamine-dextroamphetamine (ADDERALL XR) 20 MG 24 hr capsule Take 1 capsule (20 mg total) by mouth daily. 11/08/20   William Budge, MD  clonazePAM  William Murphy) 0.5 MG tablet 1 or 2 tabs for sleep if needed 11/17/20   William Budge, MD    Allergies Other  History reviewed. No pertinent family history.  Social History Social History   Tobacco Use  . Smoking status: Never Smoker  . Smokeless tobacco: Current User    Types: Snuff  Substance Use Topics  . Alcohol use: No  . Drug use: No    Review of Systems  Constitutional: Negative for fever. Eyes: Negative for visual changes. ENT: Negative for sore throat. Cardiovascular: Negative for chest pain. Respiratory: Negative for shortness of breath. Gastrointestinal: Negative for abdominal pain, vomiting and diarrhea. Genitourinary: Negative for dysuria. Musculoskeletal: Negative for back pain.  Right great toe pain as above. Skin: Negative for rash. Neurological: Negative for headaches, focal weakness or numbness. ____________________________________________  PHYSICAL EXAM:  VITAL SIGNS: ED Triage Vitals  Enc Vitals Group     BP 11/18/20 1849 105/65     Pulse Rate 11/18/20 1849 (!) 126     Resp 11/18/20 1849 16     Temp 11/18/20 1849 98.4 F (36.9 C)     Temp Source 11/18/20 1849 Oral     SpO2 11/18/20 1849 100 %     Weight 11/18/20 1844 249 lb (112.9 kg)     Height 11/18/20 1844 5\' 8"  (1.727 m)     Head Circumference --      Peak Flow --      Pain Score 11/18/20 1844 9     Pain Loc --  Pain Edu? --      Excl. in GC? --     Constitutional: Alert and oriented. Well appearing and in no distress. Head: Normocephalic and atraumatic. Eyes: Conjunctivae are normal. Normal extraocular movements Cardiovascular: Normal rate, regular rhythm. Normal distal pulses and cap refill. Respiratory: Normal respiratory effort.  Musculoskeletal: Right foot without obvious deformity or dislocation.  No joint effusions are appreciated.  No significant erythema, edema, or ecchymosis noted at the first MTP.  Patient with normal flexion extension range of toes on exam.  Mildly  tender to palpation to the plantar surface at the first MTP.  Nontender with normal range of motion in all extremities.  Neurologic: Antalgic gait without ataxia. Normal speech and language. No gross focal neurologic deficits are appreciated. Skin:  Skin is warm, dry and intact. No rash noted. ____________________________________________   LABS (pertinent positives/negatives) Labs Reviewed  CBC WITH DIFFERENTIAL/PLATELET - Abnormal; Notable for the following components:      Result Value   WBC 13.0 (*)    Neutro Abs 8.1 (*)    Lymphs Abs 4.1 (*)    All other components within normal limits  BASIC METABOLIC PANEL - Abnormal; Notable for the following components:   Calcium 8.8 (*)    All other components within normal limits  URIC ACID  ____________________________________________   RADIOLOGY  DG Right Foot IMPRESSION: Negative.  I, William Murphy, personally viewed and evaluated these images (plain radiographs) as part of my medical decision making, as well as reviewing the written report by the radiologist. ____________________________________________  PROCEDURES  Toradol 30 mg IVP Bactrim DS 1 PO Prednisone 60 mg PO Percocet 5-325 mg PO  Procedures ____________________________________________  INITIAL IMPRESSION / ASSESSMENT AND PLAN / ED COURSE  DDX: gout, septic arthritis, bunion deformity, tendinitis, turf toe, ingrown toenail  Patient presents with acute right 1st MTP pain to the plantar surface. Gross exam is without deformity, effusion, edema, warmth, or erythema. No radiologic evidence of fracture or dislocation, or osteomyelitis. Labs are generally reassuring, without elevated uric acid. He does have an elevated white count at 13. No evidence of gouty arthritis. He has some mild DJD changes at the MTP, but no other findings.  Given the patient's pain I will treat him empirically with steroids and antibiotics.  I advised him to follow-up with podiatry for  ongoing symptom management.  Return precautions have been discussed.    William Murphy was evaluated in Emergency Department on 11/18/2020 for the symptoms described in the history of present illness. He was evaluated in the context of the global COVID-19 pandemic, which necessitated consideration that the patient might be at risk for infection with the SARS-CoV-2 virus that causes COVID-19. Institutional protocols and algorithms that pertain to the evaluation of patients at risk for COVID-19 are in a state of rapid change based on information released by regulatory bodies including the CDC and federal and state organizations. These policies and algorithms were followed during the patient's care in the ED.  [place] Controlled Substances Database was reviewed. Regular RX noted. No recent narcotic RXs noted.  ____________________________________________  FINAL CLINICAL IMPRESSION(S) / ED DIAGNOSES  Final diagnoses:  Great toe pain, right      Lorita Forinash, William Ivory, PA-C 11/18/20 2304    Merwyn Katos, MD 11/18/20 2329

## 2020-11-18 NOTE — Telephone Encounter (Signed)
Please pass this advice on to William Murphy and encourage him to go on over to the [facility] site.

## 2020-11-18 NOTE — Telephone Encounter (Signed)
Called patient twice. He did not answer. Left 2 voicemails for him to call us back. Will call again to check on him.

## 2020-11-29 ENCOUNTER — Telehealth: Payer: Self-pay | Admitting: Internal Medicine

## 2020-11-29 NOTE — Telephone Encounter (Signed)
Need to gradually wean off clonazepam and adderall. Let body get back into a normal day and night rhythm without these extra medications.  Recommend long walks or other light, regular daily exercise to work off the nervous energy.

## 2020-11-29 NOTE — Telephone Encounter (Signed)
Spoke with pt and reviewed Dr. Roxy Cedar recommendations. Pt stated understanding. Nothing further needed at this time.

## 2020-11-29 NOTE — Telephone Encounter (Signed)
Called and spoke to pt. Pt states he tried the Clonazepam and it did not work for him. Pt states he got up to the two tablets and it made him depressed. Pt also states his legs are still "shakey" but now his body "feels like it is spasming".   Dr. Maple Hudson please advise. Thanks.

## 2020-12-07 ENCOUNTER — Encounter (HOSPITAL_COMMUNITY): Payer: Self-pay

## 2020-12-07 ENCOUNTER — Other Ambulatory Visit: Payer: Self-pay

## 2020-12-07 ENCOUNTER — Emergency Department (HOSPITAL_COMMUNITY)
Admission: EM | Admit: 2020-12-07 | Discharge: 2020-12-08 | Disposition: A | Discharge status: Home / Self Care | Payer: BC Managed Care – PPO | Attending: Emergency Medicine | Admitting: Emergency Medicine

## 2020-12-07 DIAGNOSIS — F17220 Nicotine dependence, chewing tobacco, uncomplicated: Secondary | ICD-10-CM | POA: Insufficient documentation

## 2020-12-07 DIAGNOSIS — K0889 Other specified disorders of teeth and supporting structures: Secondary | ICD-10-CM | POA: Insufficient documentation

## 2020-12-07 DIAGNOSIS — Z5321 Procedure and treatment not carried out due to patient leaving prior to being seen by health care provider: Secondary | ICD-10-CM | POA: Insufficient documentation

## 2020-12-07 NOTE — ED Triage Notes (Signed)
Pt sts left lower dental pain x 3 days.

## 2020-12-08 ENCOUNTER — Other Ambulatory Visit: Payer: Self-pay

## 2020-12-08 ENCOUNTER — Emergency Department (HOSPITAL_COMMUNITY)
Admission: EM | Admit: 2020-12-08 | Discharge: 2020-12-08 | Disposition: A | Discharge status: Home / Self Care | Payer: BC Managed Care – PPO | Source: Home / Self Care | Attending: Emergency Medicine | Admitting: Emergency Medicine

## 2020-12-08 DIAGNOSIS — F17220 Nicotine dependence, chewing tobacco, uncomplicated: Secondary | ICD-10-CM | POA: Insufficient documentation

## 2020-12-08 DIAGNOSIS — K0889 Other specified disorders of teeth and supporting structures: Secondary | ICD-10-CM

## 2020-12-08 MED ORDER — HYDROCODONE-ACETAMINOPHEN 5-325 MG PO TABS
2.0000 | ORAL_TABLET | Freq: Once | ORAL | Status: AC
  Administered 2020-12-08: 2 via ORAL
  Filled 2020-12-08: qty 2

## 2020-12-08 NOTE — ED Triage Notes (Signed)
Pt c/o dental pain, states was seen by [facility] and was prescribed abx, but states pain meds are not helping. Pt is unable to get a dental appt x2 weeks.

## 2020-12-08 NOTE — ED Provider Notes (Signed)
[facility] EMERGENCY DEPARTMENT Provider Note   CSN: [id number] Arrival date & time: 12/08/20  0258     History   Chief Complaint Chief Complaint  Patient presents with  . Dental Pain    HPI William Murphy is a 30 y.o. male.  Patient presents to the emergency department with a dental complaint. Symptoms began on Monday (3 days ago). The patient has tried to alleviate pain with ibuprofen.  Pain rated as severe, characterized as throbbing in nature and located rear lower left molar. Patient denies fever, night sweats, chills, difficulty swallowing or opening mouth, SOB, nuchal rigidity or decreased ROM of neck.  Patient does not have a dentist and requests a resource guide at discharge.      HPI  Past Medical History:  Diagnosis Date  . History of stomach ulcers   . Sleep apnea     Patient Active Problem List   Diagnosis Date Noted  . Obstructive sleep apnea 05/11/2020  . Morbid (severe) obesity due to excess calories (HCC) 05/11/2020  . Peptic ulcer 05/11/2020    No past surgical history on file.      Home Medications    Prior to Admission medications   Medication Sig Start Date End Date Taking? Authorizing Provider  acetaminophen (TYLENOL) 325 MG tablet Take by mouth. 04/18/20   [provider]  amphetamine-dextroamphetamine (ADDERALL XR) 20 MG 24 hr capsule Take 1 capsule (20 mg total) by mouth daily. 11/08/20   Waymon Budge, MD  clonazePAM (KLONOPIN) 0.5 MG tablet 1 or 2 tabs for sleep if needed 11/17/20   Jetty Duhamel D, MD  sulfamethoxazole-trimethoprim (BACTRIM DS) 800-160 MG tablet Take 1 tablet by mouth 2 (two) times daily. 11/18/20   Menshew, Charlesetta Ivory, PA-C    Family History No family history on file.  Social History Social History   Tobacco Use  . Smoking status: Never Smoker  . Smokeless tobacco: Current User    Types: Snuff  Substance Use Topics  . Alcohol use: No  . Drug use: No     Allergies    Meloxicam, Ketorolac, Naproxen, Prednisone, Cyclobenzaprine, Morphine, Orphenadrine, and Other   Review of Systems Review of Systems   Physical Exam Updated Vital Signs BP 139/88 (BP Location: Right Arm)   Pulse 93   Temp 98.4 F (36.9 C) (Oral)   Resp 16   Ht 5\' 8"  (1.727 m)   Wt 108.9 kg   SpO2 98%   BMI 36.49 kg/m   Physical Exam Physical Exam  Constitutional: Pt appears well-developed and well-nourished.  HENT:  Head: Normocephalic.  Right Ear: Tympanic membrane, external ear and ear canal normal.  Left Ear: Tympanic membrane, external ear and ear canal normal.  Nose: Nose normal. Right sinus exhibits no maxillary sinus tenderness and no frontal sinus tenderness. Left sinus exhibits no maxillary sinus tenderness and no frontal sinus tenderness.  Mouth/Throat: Uvula is midline, oropharynx is clear and moist and mucous membranes are normal. No oral lesions. No uvula swelling or lacerations. No oropharyngeal exudate, posterior oropharyngeal edema, posterior oropharyngeal erythema or tonsillar abscesses.  No gingival swelling, fluctuance or induration No gross abscess  No sublingual edema, tenderness to palpation, or sign of Ludwig's angina, or deep space infection Pain at tooth # 17 Eyes: Conjunctivae are normal. Pupils are equal, round, and reactive to light. Right eye exhibits no discharge. Left eye exhibits no discharge.  Neck: Normal range of motion. Neck supple.  No stridor Handling secretions without difficulty No  nuchal rigidity No cervical lymphadenopathy Cardiovascular: Normal rate, regular rhythm and normal heart sounds.   Pulmonary/Chest: Effort normal. No respiratory distress.  Equal chest rise  Abdominal: Soft. Bowel sounds are normal. Pt exhibits no distension. There is no tenderness.  Lymphadenopathy: Pt has no cervical adenopathy.  Neurological: Pt is alert and oriented x 4  Skin: Skin is warm and dry.  Psychiatric: Pt has a normal mood and affect.   Nursing note and vitals reviewed.   ED Treatments / Results  Labs (all labs ordered are listed, but only abnormal results are displayed) Labs Reviewed - No data to display  EKG    Radiology No results found.  Procedures Procedures (including critical care time)  Medications Ordered in ED Medications  HYDROcodone-acetaminophen (NORCO/VICODIN) 5-325 MG per tablet 2 tablet (has no administration in time range)     Initial Impression / Assessment and Plan / ED Course  I have reviewed the triage vital signs and the nursing notes.  Pertinent labs & imaging results that were available during my care of the patient were reviewed by me and considered in my medical decision making (see chart for details).        Patient with dentalgia.  No abscess requiring immediate incision and drainage.  Exam not concerning for Ludwig's angina or pharyngeal abscess.  Will treat with norco in ED.  Advised him to continue the penicillin. Pt instructed to follow-up with dentist.  Discussed return precautions. Pt safe for discharge.   Final Clinical Impressions(s) / ED Diagnoses   Final diagnoses:  Tooth pain    ED Discharge Orders    None       Roxy Horseman, PA-C 12/08/20 0448    Nira Conn, MD 12/08/20 [phone]

## 2020-12-21 ENCOUNTER — Telehealth: Payer: Self-pay | Admitting: Internal Medicine

## 2020-12-21 DIAGNOSIS — E0590 Thyrotoxicosis, unspecified without thyrotoxic crisis or storm: Secondary | ICD-10-CM

## 2020-12-21 NOTE — Telephone Encounter (Signed)
Call made to patient, made aware of CY recommendations. Voiced understanding. Lab order placed.   Patient aware to stop by office to have labs drawn.   Nothing further needed at this time.

## 2020-12-21 NOTE — Telephone Encounter (Signed)
Call returned to patient, confirmed DOB. Patient states CY advised that he stop taking his clonazepam and adderrall. He reports he has stopped both for about 2 weeks now and he is looking for further recommendations. He reports it feels like his body wants to run a marathon at the end of the day when he tries to prepare for bed. He is requesting further recommendations.   CY please advise.

## 2020-12-21 NOTE — Telephone Encounter (Signed)
Does not need stimulants.  Strongly recommend regular physical exercise to burn off nervous energy..  I would like to order labs- TSH   dx Hyperthryoid

## 2020-12-22 ENCOUNTER — Other Ambulatory Visit: Payer: BC Managed Care – PPO

## 2020-12-22 ENCOUNTER — Other Ambulatory Visit: Payer: Self-pay | Admitting: Internal Medicine

## 2020-12-22 DIAGNOSIS — E0590 Thyrotoxicosis, unspecified without thyrotoxic crisis or storm: Secondary | ICD-10-CM

## 2020-12-22 LAB — TSH: TSH: 0.4 u[IU]/mL (ref 0.35–4.50)

## 2021-01-11 ENCOUNTER — Telehealth: Payer: Self-pay | Admitting: Internal Medicine

## 2021-01-11 MED ORDER — AMPHETAMINE-DEXTROAMPHET ER 20 MG PO CP24: 20.0000 mg | ORAL_CAPSULE | Freq: Every day | ORAL | 0 refills | Status: DC

## 2021-01-11 NOTE — Telephone Encounter (Signed)
Adderall sent to [facility]

## 2021-01-11 NOTE — Telephone Encounter (Addendum)
Spoke with pt  He states that DME told him it would be mid June before he will get his BIPAP machine  He is having trouble staying awake during the day  Would like to know if he can try adderall again  He got rid of the pills that he had left from when he took before- had the pharmacy destroy  Please advise, thanks!

## 2021-02-09 ENCOUNTER — Telehealth: Payer: Self-pay | Admitting: Internal Medicine

## 2021-02-09 MED ORDER — AMPHETAMINE-DEXTROAMPHET ER 20 MG PO CP24: 20.0000 mg | ORAL_CAPSULE | Freq: Every day | ORAL | 0 refills | Status: DC

## 2021-02-09 NOTE — Telephone Encounter (Signed)
Called and spoke with patient. He is requesting a refill on his Adderall XR 20mg  to be sent to [facility] in [place]. Last refill was sent on 01/11/21 for 30 capsules. Last OV was on 11/17/20 with CY. Next OV is scheduled for 05/01/21.   CY, please advise if you are ok with this refill. Thanks!

## 2021-02-09 NOTE — Telephone Encounter (Signed)
Adderall refilled

## 2021-02-09 NOTE — Telephone Encounter (Signed)
Left message for patient to call back  

## 2021-02-14 ENCOUNTER — Ambulatory Visit: Payer: BC Managed Care – PPO | Admitting: Internal Medicine

## 2021-02-17 ENCOUNTER — Telehealth: Payer: Self-pay | Admitting: Internal Medicine

## 2021-02-17 NOTE — Telephone Encounter (Addendum)
Spoke with the pt He states that the recent rx we sent for the adderall 20 mg XR is incorrect  He states that the pill is a different color, so he looked it up online and states that what he had been getting was immediate release  According to his rx history, he has never been prescribed anything other than XR  He disagreed, so I called and spoke with Sam, pharmacist at [facility]  He told me that they used a different manufacturer and that is indeed why pill appears different  He confirmed that the pt has never received immediate release, but always the XR   I spoke with the pt and made him aware of this  He states that the rx he has received with this manufacturer that [facility] used, is not going to work for him  He says that it is 100% ineffective  Dr Maple Hudson, please advise, thanks!

## 2021-02-17 NOTE — Telephone Encounter (Signed)
Called and left message on voicemail to please return phone call. Contact number provided. 

## 2021-02-20 NOTE — Telephone Encounter (Signed)
LMTCB for the pt 

## 2021-02-20 NOTE — Telephone Encounter (Signed)
Spoke with the pt and notified of response per Dr Young  °She verbalized understanding  °Nothing further needed °

## 2021-02-20 NOTE — Telephone Encounter (Signed)
There can be differences from one manufacturer to another, but They have to show the FDA that the same drug from two different manufacturers is so similar that it shouldn't make a difference.  Next time he is due for a refill he can try getting it from a different pharmacy.

## 2021-02-28 DIAGNOSIS — M545 Low back pain, unspecified: Secondary | ICD-10-CM | POA: Insufficient documentation

## 2021-02-28 DIAGNOSIS — G4700 Insomnia, unspecified: Secondary | ICD-10-CM | POA: Insufficient documentation

## 2021-02-28 NOTE — Assessment & Plan Note (Signed)
Emphasis on bedroom environment, good sleep hygiene. Recognize back pain contributes and he needs to seek help for that. Plan- try clonazepam as discussed

## 2021-02-28 NOTE — Assessment & Plan Note (Signed)
Truck driver

## 2021-03-13 ENCOUNTER — Telehealth: Payer: Self-pay | Admitting: Internal Medicine

## 2021-03-13 MED ORDER — AMPHETAMINE-DEXTROAMPHET ER 20 MG PO CP24: 20.0000 mg | ORAL_CAPSULE | Freq: Every day | ORAL | 0 refills | Status: DC

## 2021-03-13 NOTE — Telephone Encounter (Signed)
Called and left message on voicemail to please return phone call. Contact number provided. 

## 2021-03-13 NOTE — Telephone Encounter (Signed)
Called and made patient aware that medication (Adderall) was filled and sent to [facility] by Dr Maple Hudson. Patient expressed full understanding. Nothing further needed at this time.

## 2021-03-13 NOTE — Telephone Encounter (Signed)
Called and spoke with patient who states he needs a refill on Adderall XR 20mg  sent in to [facility] in [place]. Patient confirmed the new pharmacy and writer [facility] the [facility] the [facility] per patient request.  Last office visit with Dr Baldwin park was on 11/17/20.  Dr 11/19/20 please advise.

## 2021-03-13 NOTE — Telephone Encounter (Signed)
Attempted to call phone number on chart [phone] and could not reach patient due to getting busy signal.

## 2021-04-09 ENCOUNTER — Emergency Department (HOSPITAL_COMMUNITY)
Admission: EM | Admit: 2021-04-09 | Discharge: 2021-04-10 | Disposition: A | Discharge status: Home / Self Care | Payer: BC Managed Care – PPO | Attending: Emergency Medicine | Admitting: Emergency Medicine

## 2021-04-09 ENCOUNTER — Other Ambulatory Visit: Payer: Self-pay

## 2021-04-09 DIAGNOSIS — S301XXA Contusion of abdominal wall, initial encounter: Secondary | ICD-10-CM | POA: Diagnosis not present

## 2021-04-09 DIAGNOSIS — S6991XA Unspecified injury of right wrist, hand and finger(s), initial encounter: Secondary | ICD-10-CM | POA: Diagnosis present

## 2021-04-09 DIAGNOSIS — S62610A Displaced fracture of proximal phalanx of right index finger, initial encounter for closed fracture: Secondary | ICD-10-CM | POA: Insufficient documentation

## 2021-04-09 DIAGNOSIS — S6291XA Unspecified fracture of right wrist and hand, initial encounter for closed fracture: Secondary | ICD-10-CM

## 2021-04-09 DIAGNOSIS — F17290 Nicotine dependence, other tobacco product, uncomplicated: Secondary | ICD-10-CM | POA: Diagnosis not present

## 2021-04-09 DIAGNOSIS — T148XXA Other injury of unspecified body region, initial encounter: Secondary | ICD-10-CM

## 2021-04-09 DIAGNOSIS — S20211A Contusion of right front wall of thorax, initial encounter: Secondary | ICD-10-CM | POA: Insufficient documentation

## 2021-04-09 DIAGNOSIS — Y92410 Unspecified street and highway as the place of occurrence of the external cause: Secondary | ICD-10-CM | POA: Insufficient documentation

## 2021-04-09 LAB — URINALYSIS, ROUTINE W REFLEX MICROSCOPIC
Glucose, UA: NEGATIVE mg/dL
Hgb urine dipstick: NEGATIVE
Ketones, ur: NEGATIVE mg/dL
Nitrite: NEGATIVE
Protein, ur: NEGATIVE mg/dL
Specific Gravity, Urine: 1.029 (ref 1.005–1.030)
pH: 6 (ref 5.0–8.0)

## 2021-04-09 LAB — CBC WITH DIFFERENTIAL/PLATELET
Abs Immature Granulocytes: 0.09 10*3/uL — ABNORMAL HIGH (ref 0.00–0.07)
Basophils Absolute: 0.1 10*3/uL (ref 0.0–0.1)
Basophils Relative: 0 %
Eosinophils Absolute: 0.4 10*3/uL (ref 0.0–0.5)
Eosinophils Relative: 4 %
HCT: 40 % (ref 39.0–52.0)
Hemoglobin: 13.6 g/dL (ref 13.0–17.0)
Immature Granulocytes: 1 %
Lymphocytes Relative: 33 %
Lymphs Abs: 3.7 10*3/uL (ref 0.7–4.0)
MCH: 30.5 pg (ref 26.0–34.0)
MCHC: 34 g/dL (ref 30.0–36.0)
MCV: 89.7 fL (ref 80.0–100.0)
Monocytes Absolute: 0.7 10*3/uL (ref 0.1–1.0)
Monocytes Relative: 6 %
Neutro Abs: 6.3 10*3/uL (ref 1.7–7.7)
Neutrophils Relative %: 56 %
Platelets: 340 10*3/uL (ref 150–400)
RBC: 4.46 MIL/uL (ref 4.22–5.81)
RDW: 13.1 % (ref 11.5–15.5)
WBC: 11.2 10*3/uL — ABNORMAL HIGH (ref 4.0–10.5)
nRBC: 0 % (ref 0.0–0.2)

## 2021-04-09 MED ORDER — FENTANYL CITRATE (PF) 100 MCG/2ML IJ SOLN
50.0000 ug | Freq: Once | INTRAMUSCULAR | Status: AC
  Administered 2021-04-09: 50 ug via INTRAVENOUS
  Filled 2021-04-09: qty 2
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
## 2021-04-10 ENCOUNTER — Emergency Department (HOSPITAL_COMMUNITY): Payer: BC Managed Care – PPO

## 2021-04-10 ENCOUNTER — Other Ambulatory Visit (HOSPITAL_COMMUNITY): Payer: Self-pay

## 2021-04-10 LAB — COMPREHENSIVE METABOLIC PANEL
ALT: 46 U/L — ABNORMAL HIGH (ref 0–44)
AST: 48 U/L — ABNORMAL HIGH (ref 15–41)
Albumin: 3.3 g/dL — ABNORMAL LOW (ref 3.5–5.0)
Alkaline Phosphatase: 80 U/L (ref 38–126)
Anion gap: 7 (ref 5–15)
BUN: 9 mg/dL (ref 6–20)
CO2: 26 mmol/L (ref 22–32)
Calcium: 9 mg/dL (ref 8.9–10.3)
Chloride: 108 mmol/L (ref 98–111)
Creatinine, Ser: 0.74 mg/dL (ref 0.61–1.24)
GFR, Estimated: >60 mL/min (ref >60)
Glucose, Bld: 98 mg/dL (ref 70–99)
Potassium: 4.2 mmol/L (ref 3.5–5.1)
Sodium: 141 mmol/L (ref 135–145)
Total Bilirubin: 1.5 mg/dL — ABNORMAL HIGH (ref 0.3–1.2)
Total Protein: 6.6 g/dL (ref 6.5–8.1)

## 2021-04-10 MED ORDER — IOHEXOL 300 MG/ML  SOLN
100.0000 mL | Freq: Once | INTRAMUSCULAR | Status: AC | PRN
  Administered 2021-04-10: 100 mL via INTRAVENOUS

## 2021-04-10 MED ORDER — HYDROMORPHONE HCL 1 MG/ML IJ SOLN
1.0000 mg | Freq: Once | INTRAMUSCULAR | Status: AC
  Administered 2021-04-10: 1 mg via INTRAVENOUS
  Filled 2021-04-10: qty 1

## 2021-04-10 MED ORDER — HYDROCODONE-ACETAMINOPHEN 5-325 MG PO TABS
2.0000 | ORAL_TABLET | Freq: Once | ORAL | Status: AC
  Administered 2021-04-10: 2 via ORAL
  Filled 2021-04-10: qty 2

## 2021-04-10 MED ORDER — HYDROCODONE-ACETAMINOPHEN 5-325 MG PO TABS
2.0000 | ORAL_TABLET | Freq: Four times a day (QID) | ORAL | 0 refills | Status: AC | PRN
  Filled 2021-04-10: qty 30, 4d supply, fill #0

## 2021-04-10 MED ORDER — ONDANSETRON HCL 4 MG/2ML IJ SOLN
4.0000 mg | Freq: Once | INTRAMUSCULAR | Status: AC
  Administered 2021-04-10: 4 mg via INTRAVENOUS
  Filled 2021-04-10: qty 2

## 2021-04-10 NOTE — ED Notes (Addendum)
Pt stated that he is deciding to leave. RN was notified and RN spoke with the Pt about x-rays and got the Pt to stay.
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
## 2021-04-10 NOTE — Discharge Instructions (Addendum)
You have been seen and discharged from the emergency department.  You were found to have a right index finger fracture, keep the index and middle finger buddy taped for support.  Follow-up with Dr. Georga Bora, hand surgeon.  Take pain medicine as needed.  Do not mix this medication with alcohol or other sedating medications. Do not drive or do heavy physical activity and to know how this medication affects you.  It may cause drowsiness.  Stay well-hydrated, take nausea medicine as needed.  Follow-up with your primary provider for reevaluation and further care. Take home medications as prescribed. If you have any worsening symptoms or further concerns for your health please return to an emergency department for further evaluation.

## 2021-04-10 NOTE — ED Notes (Signed)
Pt states he wants to leave. Informed pt his results at this time are inconclusive and is waiting to see a doctor for further evaluation. Informed if he left it could pose a risk to his health/safety.   Pt states he wants to leave because of the pain and fent. doesnt help he needs something stronger. Pt informed I could not safely give him any more medication since he is in the lobby. He would have to wait to get to the back for more medication. Pt verbalized understanding and will continue to wait in lobby.   Lobby NTs with this RN at time of interaction.

## 2021-04-21 ENCOUNTER — Telehealth: Payer: Self-pay | Admitting: Internal Medicine

## 2021-04-21 MED ORDER — AMPHETAMINE-DEXTROAMPHET ER 20 MG PO CP24: 20.0000 mg | ORAL_CAPSULE | Freq: Every day | ORAL | 0 refills | Status: DC

## 2021-04-21 NOTE — Telephone Encounter (Signed)
Adderall refilled

## 2021-04-21 NOTE — Telephone Encounter (Signed)
Received faxed refill request from pt called for refill  Medication name/strength/dose: Adderall xr 20 mg Medication last rx'd: 03/13/2021 Quantity and number of refills last rx'd: 30 with no refills Instructions: Take one capsule by mouth daily  Last OV: 11/17/2020 Next OV: 05/01/2021  CY please advise on refill request  Allergies  Allergen Reactions   Meloxicam Swelling, Nausea And Vomiting and Nausea Only   Ketorolac Nausea And Vomiting    Other reaction(s): GI Upset (intolerance) Other reaction(s): GI Upset (intolerance)    Naproxen Nausea And Vomiting   Prednisone Nausea And Vomiting, Rash and Other (See Comments)    vomiting    Cyclobenzaprine Nausea Only   Morphine Rash   Orphenadrine Nausea Only   Other Nausea And Vomiting and Nausea Only    NSAIDs d/t ulcers   Current Outpatient Medications on File Prior to Visit  Medication Sig Dispense Refill   acetaminophen (TYLENOL) 500 MG tablet Take 1,000 mg by mouth every 6 (six) hours as needed for moderate pain or headache.     amphetamine-dextroamphetamine (ADDERALL XR) 20 MG 24 hr capsule Take 1 capsule (20 mg total) by mouth daily. (Patient taking differently: Take 20 mg by mouth daily as needed (sleep apnea).) 30 capsule 0   clonazePAM (KLONOPIN) 0.5 MG tablet 1 or 2 tabs for sleep if needed (Patient not taking: No sig reported) 62 tablet 1   naproxen sodium (ALEVE) 220 MG tablet Take 440 mg by mouth daily as needed (pain/headache).     sulfamethoxazole-trimethoprim (BACTRIM DS) 800-160 MG tablet Take 1 tablet by mouth 2 (two) times daily. (Patient not taking: No sig reported) 20 tablet 0   No current facility-administered medications on file prior to visit.

## 2021-04-21 NOTE — Telephone Encounter (Signed)
Noted  
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
## 2021-05-01 ENCOUNTER — Ambulatory Visit: Payer: BC Managed Care – PPO | Admitting: Internal Medicine

## 2021-06-23 ENCOUNTER — Telehealth: Payer: Self-pay | Admitting: Internal Medicine

## 2021-06-23 NOTE — Telephone Encounter (Signed)
Please send this message on to Dr. Maple Hudson.  Patient has been out of Adderall since July 2022.  Today June 23, 2021 Friday.  I am sure Adderall  can wait till June 26, 2021 Monday  Request Dr. Maple Hudson to address the letter upon his return early next week
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
## 2021-06-27 NOTE — Telephone Encounter (Signed)
Patient checking on letter and refill. Patient phone number is [phone].

## 2021-06-27 NOTE — Telephone Encounter (Signed)
Dr. Maple Hudson please advise on the messages for this patients medications

## 2021-06-28 ENCOUNTER — Encounter: Payer: Self-pay | Admitting: Internal Medicine

## 2021-06-28 ENCOUNTER — Other Ambulatory Visit: Payer: Self-pay | Admitting: Internal Medicine

## 2021-06-28 MED ORDER — AMPHETAMINE-DEXTROAMPHET ER 20 MG PO CP24: 20.0000 mg | ORAL_CAPSULE | Freq: Every day | ORAL | 0 refills | Status: AC

## 2021-06-28 NOTE — Telephone Encounter (Signed)
Letter has been printed and on C-pod, ready to mail. Please contact him to arrange office f/u visit. Held-spot ok. I had wanted to see him again 3 months after last visit in February.  Adderall script sent to his drug store.
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
## 2021-06-29 NOTE — Telephone Encounter (Signed)
Called and spoke with patient's mother Tammy per DPR to let her know that Dr. Maple Hudson wrote letter and that it was in the mail, that RX was sent to pharmacy and that he wanted patient scheduled for a follow up. Looks like patient is scheduled for next Friday, mother given that appt information. Nothing further needed at this time.  Next Appt With Pulmon[facility], MD)07/07/2021 at 11:30 AM
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
## 2021-07-07 ENCOUNTER — Ambulatory Visit: Payer: Self-pay | Admitting: Internal Medicine
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
